# Patient Record
Sex: Female | Born: 2005 | Race: White | Hispanic: No | Marital: Single | Smoking: Current every day smoker
Health system: Southern US, Community
[De-identification: ages and names within clinical notes are randomized; demographics above are authoritative.]

## PROBLEM LIST (undated history)

## (undated) DIAGNOSIS — F32A Depression, unspecified: Secondary | ICD-10-CM

## (undated) DIAGNOSIS — T7840XA Allergy, unspecified, initial encounter: Secondary | ICD-10-CM

## (undated) HISTORY — DX: Allergy, unspecified, initial encounter: T78.40XA

## (undated) HISTORY — DX: Depression, unspecified: F32.A

## (undated) HISTORY — PX: TONSILLECTOMY: SUR1361

---
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
## 2023-01-02 DIAGNOSIS — F331 Major depressive disorder, recurrent, moderate: Secondary | ICD-10-CM | POA: Diagnosis not present

## 2023-01-23 DIAGNOSIS — F331 Major depressive disorder, recurrent, moderate: Secondary | ICD-10-CM | POA: Diagnosis not present

## 2023-01-24 DIAGNOSIS — H5213 Myopia, bilateral: Secondary | ICD-10-CM | POA: Diagnosis not present

## 2023-01-30 DIAGNOSIS — F331 Major depressive disorder, recurrent, moderate: Secondary | ICD-10-CM | POA: Diagnosis not present

## 2023-02-06 DIAGNOSIS — F331 Major depressive disorder, recurrent, moderate: Secondary | ICD-10-CM | POA: Diagnosis not present

## 2023-02-20 DIAGNOSIS — F331 Major depressive disorder, recurrent, moderate: Secondary | ICD-10-CM | POA: Diagnosis not present

## 2023-03-06 DIAGNOSIS — F331 Major depressive disorder, recurrent, moderate: Secondary | ICD-10-CM | POA: Diagnosis not present

## 2023-03-13 DIAGNOSIS — F331 Major depressive disorder, recurrent, moderate: Secondary | ICD-10-CM | POA: Diagnosis not present

## 2023-03-22 DIAGNOSIS — F331 Major depressive disorder, recurrent, moderate: Secondary | ICD-10-CM | POA: Diagnosis not present

## 2023-04-03 DIAGNOSIS — F331 Major depressive disorder, recurrent, moderate: Secondary | ICD-10-CM | POA: Diagnosis not present

## 2023-04-18 DIAGNOSIS — F331 Major depressive disorder, recurrent, moderate: Secondary | ICD-10-CM | POA: Diagnosis not present

## 2023-05-01 DIAGNOSIS — F331 Major depressive disorder, recurrent, moderate: Secondary | ICD-10-CM | POA: Diagnosis not present

## 2023-05-15 DIAGNOSIS — F331 Major depressive disorder, recurrent, moderate: Secondary | ICD-10-CM | POA: Diagnosis not present

## 2023-05-20 ENCOUNTER — Ambulatory Visit: Admitting: Family Medicine

## 2023-05-20 ENCOUNTER — Encounter: Payer: Self-pay | Admitting: Family Medicine

## 2023-05-20 VITALS — BP 122/78 | HR 96 | Temp 98.7°F | Ht 63.8 in | Wt 232.2 lb

## 2023-05-20 DIAGNOSIS — L989 Disorder of the skin and subcutaneous tissue, unspecified: Secondary | ICD-10-CM | POA: Diagnosis not present

## 2023-05-20 MED ORDER — SULFAMETHOXAZOLE-TRIMETHOPRIM 800-160 MG PO TABS: 1.0000 | ORAL_TABLET | Freq: Two times a day (BID) | ORAL | 0 refills | Status: DC

## 2023-05-20 NOTE — Progress Notes (Unsigned)
 She is still in counseling.  Mood d/w pt, improved in the meantime.  She thought that therapy helped and was moved from weekly to every other week.    D/w pt about prev skin lesions, treated with septra .  She has some scarring from the prev lesion on the R breast.    She had new lesions on lower abd wall and inner thigh.  Not painful.  Not itchy or burning.  Incidentally noted.  No fevers.  See exam.  Chaperoned exam.  Discussed weight management with diet exercise and adequate sleep.  Meds, vitals, and allergies reviewed.   ROS: Per HPI unless specifically indicated in ROS section   Nad Ncat Neck supple, no LA Rrr Ctab Abdomen soft.  Chaperoned exam. 4 nodules noted horizontally across the lower abd wall.  Not fluctuant.

## 2023-05-20 NOTE — Patient Instructions (Signed)
 Use gold bond powder to keep the area dry in the meantime.  If not better or if worse, then start septra .  Update me as needed.  Take care.  Glad to see you.

## 2023-05-22 NOTE — Assessment & Plan Note (Signed)
 Discussed options.  These appear to be inflammatory across the lower portion of the pannus. Use gold bond powder to keep the area dry in the meantime.  If not better or if worse, then start septra .  She agrees to plan.  Update me as needed.  None of the lesions appear to need incision and drainage. She does not have a history of axillary lesions suggestive of hidradenitis suppurativa.

## 2023-05-29 DIAGNOSIS — F331 Major depressive disorder, recurrent, moderate: Secondary | ICD-10-CM | POA: Diagnosis not present

## 2023-06-07 DIAGNOSIS — F331 Major depressive disorder, recurrent, moderate: Secondary | ICD-10-CM | POA: Diagnosis not present

## 2023-06-14 DIAGNOSIS — F331 Major depressive disorder, recurrent, moderate: Secondary | ICD-10-CM | POA: Diagnosis not present

## 2023-06-21 DIAGNOSIS — F331 Major depressive disorder, recurrent, moderate: Secondary | ICD-10-CM | POA: Diagnosis not present

## 2023-06-27 ENCOUNTER — Other Ambulatory Visit (HOSPITAL_COMMUNITY)
Admission: RE | Admit: 2023-06-27 | Discharge: 2023-06-27 | Disposition: A | Discharge status: Home / Self Care | Source: Ambulatory Visit | Attending: Obstetrics | Admitting: Obstetrics

## 2023-06-27 ENCOUNTER — Ambulatory Visit (INDEPENDENT_AMBULATORY_CARE_PROVIDER_SITE_OTHER): Admitting: Obstetrics

## 2023-06-27 ENCOUNTER — Encounter: Payer: Self-pay | Admitting: Obstetrics

## 2023-06-27 VITALS — BP 123/81 | HR 94 | Ht 64.0 in | Wt 233.0 lb

## 2023-06-27 DIAGNOSIS — Z113 Encounter for screening for infections with a predominantly sexual mode of transmission: Secondary | ICD-10-CM | POA: Insufficient documentation

## 2023-06-27 DIAGNOSIS — Z01419 Encounter for gynecological examination (general) (routine) without abnormal findings: Secondary | ICD-10-CM

## 2023-06-27 DIAGNOSIS — Z3042 Encounter for surveillance of injectable contraceptive: Secondary | ICD-10-CM

## 2023-06-27 DIAGNOSIS — Z3202 Encounter for pregnancy test, result negative: Secondary | ICD-10-CM | POA: Diagnosis not present

## 2023-06-27 DIAGNOSIS — Z3009 Encounter for other general counseling and advice on contraception: Secondary | ICD-10-CM

## 2023-06-27 LAB — POCT URINE PREGNANCY: Preg Test, Ur: NEGATIVE

## 2023-06-27 MED ORDER — MEDROXYPROGESTERONE ACETATE 150 MG/ML IM SUSY
150.0000 mg | PREFILLED_SYRINGE | Freq: Once | INTRAMUSCULAR | Status: AC
  Administered 2023-06-27: 150 mg via INTRAMUSCULAR

## 2023-06-27 MED ORDER — CLINDAMYCIN PHOSPHATE 1 % EX SOLN: Freq: Two times a day (BID) | CUTANEOUS | 6 refills | Status: DC

## 2023-06-27 NOTE — Progress Notes (Signed)
 ANNUAL GYNECOLOGICAL EXAM  SUBJECTIVE  HPI  Andrea Hawkins is a 18 y.o.-year-old [id number] who presents for an annual gynecological exam today.  She denies pelvic pain, dyspareunia, abnormal vaginal bleeding or discharge, and UTI symptoms. She reports that since February, her period has been heavier and she has had more cramping. She interested in hormonal contraception. She is currently sexually active with one female partner. She has recurring boils on and around her breasts and groin/thighs that she would like examined.  Medical/Surgical History Past Medical History:  Diagnosis Date   Allergy    Depression    Past Surgical History:  Procedure Laterality Date   TONSILLECTOMY      Social History Lives with grandparents. Feels safe there Exercise: yes Substances: Daily vape. Denies EtOH, tobacco, and recreational drugs  Obstetric History OB History     Gravida  0   Para  0   Term  0   Preterm  0   AB  0   Living  0      SAB  0   IAB  0   Ectopic  0   Multiple  0   Live Births  0            GYN/Menstrual History Patient's last menstrual period was 06/15/2023. Regular monthly periods that last 5 days Last Pap: N/A d/t age Contraception: withdrawal, NFP  Prevention Mammogram: at 84 Colonoscopy: at 28  Current Medications Outpatient Medications Prior to Visit  Medication Sig   sulfamethoxazole -trimethoprim  (BACTRIM  DS) 800-160 MG tablet Take 1 tablet by mouth 2 (two) times daily.   No facility-administered medications prior to visit.        ROS Constitutional: Denied constitutional symptoms, night sweats, recent illness, fatigue, fever, insomnia and weight loss.  Eyes: Denied eye symptoms, eye pain, photophobia, vision change and visual disturbance.  Ears/Nose/Throat/Neck: Denied ear, nose, throat or neck symptoms, hearing loss, nasal discharge, sinus congestion and sore throat.  Cardiovascular: Denied cardiovascular symptoms, arrhythmia,  chest pain/pressure, edema, exercise intolerance, orthopnea and palpitations.  Respiratory: Denied pulmonary symptoms, asthma, pleuritic pain, productive sputum, cough, dyspnea and wheezing.  Gastrointestinal: Denied gastro-esophageal reflux, melena, nausea and vomiting.  Genitourinary: Denied genitourinary symptoms including symptomatic vaginal discharge, pelvic relaxation issues, and urinary complaints.  Musculoskeletal: Denied musculoskeletal symptoms, stiffness, swelling, muscle weakness and myalgia.  Dermatologic: Frequent boils in groin and breast area  Neurologic: Denied neurology symptoms, dizziness, headache, neck pain and syncope.  Psychiatric: Depression/anxiety  Endocrine: Denied endocrine symptoms including hot flashes and night sweats.    OBJECTIVE  Ht 5' 4 (1.626 m)   Wt 233 lb (105.7 kg)   LMP 06/15/2023   BMI 39.99 kg/m    Physical examination General NAD, Conversant  HEENT Atraumatic; Op clear with mmm.  Normo-cephalic. Pupils reactive. Anicteric sclerae  Thyroid/Neck Smooth without nodularity or enlargement. Normal ROM.  Neck Supple.  Skin No rashes, lesions or ulceration. Normal palpated skin turgor. No nodularity.  Breasts: No masses or discharge.  Symmetric.  No axillary adenopathy. Multiple healing furuncles under and around breasts.  Lungs: Clear to auscultation.No rales or wheezes. Normal Respiratory effort, no retractions.  Heart: NSR.  No murmurs or rubs appreciated. No peripheral edema  Abdomen: Soft.  Non-tender.  No masses.  No HSM. No hernia  Extremities: Moves all appropriately.  Normal ROM for age. No lymphadenopathy.  Neuro: Oriented to PPT.  Normal mood. Normal affect.     Pelvic: Declined    ASSESSMENT  1) Annual exam 2) Desires contraception  3) Hidradenitis suppurativa  PLAN 1) Physical exam as noted. Discussed healthy lifestyle choices and preventive care. STI testing today. Routine labs done with PCP. Discussed safe sex practices. Pap at  19. 2) Discussed all available options. Andrea Hawkins would like to start Depo. Injection give today. Follow up information given. Recommend vitamin D and calcium supplements. 3) Recommend Hibiclens wash with showers, Lume deodorant in groin and under breasts. Rx sent for clindamycin solution. Refer to derm if no improvement.  Return in one year for annual exam or as needed for concerns.   Andrea Hawkins, CNM

## 2023-06-28 LAB — HEP, RPR, HIV PANEL
HIV Screen 4th Generation wRfx: NONREACTIVE
Hepatitis B Surface Ag: NEGATIVE
RPR Ser Ql: NONREACTIVE

## 2023-07-01 LAB — CERVICOVAGINAL ANCILLARY ONLY
Bacterial Vaginitis (gardnerella): POSITIVE — AB
Candida Glabrata: NEGATIVE
Candida Vaginitis: NEGATIVE
Chlamydia: NEGATIVE
Comment: NEGATIVE
Comment: NEGATIVE
Comment: NEGATIVE
Comment: NEGATIVE
Comment: NEGATIVE
Comment: NORMAL
Neisseria Gonorrhea: NEGATIVE
Trichomonas: NEGATIVE

## 2023-07-03 ENCOUNTER — Encounter: Payer: Self-pay | Admitting: Obstetrics

## 2023-07-03 ENCOUNTER — Other Ambulatory Visit: Payer: Self-pay | Admitting: Obstetrics

## 2023-07-03 MED ORDER — METRONIDAZOLE 500 MG PO TABS: 500.0000 mg | ORAL_TABLET | Freq: Two times a day (BID) | ORAL | 0 refills | Status: DC

## 2023-07-03 NOTE — Progress Notes (Signed)
+  BV. Rx for metronidazole 500 mg PO BID x 7 days sent to pharmacy. Leeandra notified via [place].  M. Norberto Sorenson, CNM

## 2023-07-04 DIAGNOSIS — F331 Major depressive disorder, recurrent, moderate: Secondary | ICD-10-CM | POA: Diagnosis not present

## 2023-07-17 DIAGNOSIS — F331 Major depressive disorder, recurrent, moderate: Secondary | ICD-10-CM | POA: Diagnosis not present

## 2023-07-23 DIAGNOSIS — F331 Major depressive disorder, recurrent, moderate: Secondary | ICD-10-CM | POA: Diagnosis not present

## 2023-08-07 DIAGNOSIS — F331 Major depressive disorder, recurrent, moderate: Secondary | ICD-10-CM | POA: Diagnosis not present

## 2023-08-13 DIAGNOSIS — F331 Major depressive disorder, recurrent, moderate: Secondary | ICD-10-CM | POA: Diagnosis not present

## 2023-08-21 DIAGNOSIS — F331 Major depressive disorder, recurrent, moderate: Secondary | ICD-10-CM | POA: Diagnosis not present

## 2023-09-04 DIAGNOSIS — F331 Major depressive disorder, recurrent, moderate: Secondary | ICD-10-CM | POA: Diagnosis not present

## 2023-09-17 ENCOUNTER — Ambulatory Visit

## 2023-09-17 VITALS — BP 121/82 | HR 85 | Ht 64.0 in | Wt 233.0 lb

## 2023-09-17 DIAGNOSIS — Z3042 Encounter for surveillance of injectable contraceptive: Secondary | ICD-10-CM | POA: Diagnosis not present

## 2023-09-17 MED ORDER — MEDROXYPROGESTERONE ACETATE 150 MG/ML IM SUSY
150.0000 mg | PREFILLED_SYRINGE | Freq: Once | INTRAMUSCULAR | Status: AC
  Administered 2023-09-17: 150 mg via INTRAMUSCULAR

## 2023-09-17 NOTE — Progress Notes (Signed)
    NURSE VISIT NOTE  Subjective:    Patient ID: Andrea Hawkins, female    DOB: 10/05/2005, 18 y.o.   MRN: [id number]  HPI  Patient is a 18 y.o. G0P0000 female who presents for depo provera  injection.   Objective:    There were no vitals taken for this visit.  Last Annual: 06/27/23. Last pap: NA. Last Depo-Provera : 06/27/23. Side Effects if any: none. Serum HCG indicated? No . Depo-Provera  150 mg IM given by: Annalee Sanders, CMA. Site: Right Deltoid  Lab Review  No results found for any visits on 09/17/23.  Assessment:   1. Encounter for Depo-Provera  contraception      Plan:   Next appointment due between nov 18 dec 2 .      Faren Florence H Alyria Krack, CMA

## 2023-09-18 DIAGNOSIS — F331 Major depressive disorder, recurrent, moderate: Secondary | ICD-10-CM | POA: Diagnosis not present

## 2023-10-02 DIAGNOSIS — F331 Major depressive disorder, recurrent, moderate: Secondary | ICD-10-CM | POA: Diagnosis not present

## 2023-10-30 DIAGNOSIS — F331 Major depressive disorder, recurrent, moderate: Secondary | ICD-10-CM | POA: Diagnosis not present

## 2023-11-13 DIAGNOSIS — F331 Major depressive disorder, recurrent, moderate: Secondary | ICD-10-CM | POA: Diagnosis not present

## 2023-11-27 DIAGNOSIS — F331 Major depressive disorder, recurrent, moderate: Secondary | ICD-10-CM | POA: Diagnosis not present

## 2023-11-29 ENCOUNTER — Ambulatory Visit

## 2023-11-29 VITALS — BP 114/75 | HR 95 | Resp 16 | Ht 64.0 in | Wt 239.0 lb

## 2023-11-29 DIAGNOSIS — Z3042 Encounter for surveillance of injectable contraceptive: Secondary | ICD-10-CM | POA: Diagnosis not present

## 2023-11-29 MED ORDER — MEDROXYPROGESTERONE ACETATE 150 MG/ML IM SUSP
150.0000 mg | Freq: Once | INTRAMUSCULAR | Status: AC
  Administered 2023-11-29: 150 mg via INTRAMUSCULAR

## 2023-11-29 NOTE — Progress Notes (Signed)
    NURSE VISIT NOTE  Subjective:    Patient ID: Andrea Hawkins, female    DOB: [birth year], 18 y.o.   MRN: [id number]  HPI  Patient is a 18 y.o. G0P0000 female who presents for depo provera  injection.   Objective:    BP 114/75   Pulse 95   Resp 16   Ht 5' 4 (1.626 m)   Wt 239 lb (108.4 kg)   BMI 41.02 kg/m   Last Annual: 06/27/2023. Last pap: Not age appropriate. Last Depo-Provera : 09/17/2023. Side Effects if any: none. Serum HCG indicated? No . Depo-Provera  150 mg IM given by: Camelia Fetters, CMA. Site: Left Deltoid  Lab Review  No results found for any visits on 11/29/23.  Assessment:   1. Encounter for Depo-Provera  contraception      Plan:   Next appointment due between Jan. 30 and Feb. 13    Camelia Fetters, CMA [facility]

## 2023-11-29 NOTE — Patient Instructions (Signed)

## 2023-12-10 DIAGNOSIS — F331 Major depressive disorder, recurrent, moderate: Secondary | ICD-10-CM | POA: Diagnosis not present

## 2023-12-25 DIAGNOSIS — F331 Major depressive disorder, recurrent, moderate: Secondary | ICD-10-CM | POA: Diagnosis not present

## 2024-01-07 DIAGNOSIS — F331 Major depressive disorder, recurrent, moderate: Secondary | ICD-10-CM | POA: Diagnosis not present

## 2024-02-21 ENCOUNTER — Ambulatory Visit

## 2024-02-21 VITALS — BP 121/88 | HR 93 | Ht 64.0 in | Wt 237.0 lb

## 2024-02-21 DIAGNOSIS — Z3042 Encounter for surveillance of injectable contraceptive: Secondary | ICD-10-CM

## 2024-02-21 MED ORDER — MEDROXYPROGESTERONE ACETATE 150 MG/ML IM SUSP
150.0000 mg | Freq: Once | INTRAMUSCULAR | Status: AC
  Administered 2024-02-21: 150 mg via INTRAMUSCULAR

## 2024-02-21 NOTE — Patient Instructions (Signed)
 Medroxyprogesterone  Injection (Contraception) What is this medication? MEDROXYPROGESTERONE  (me DROX ee proe JES te rone) prevents ovulation and pregnancy. It belongs to a group of medications called contraceptives. This medication is a progestin hormone. This medicine may be used for other purposes; ask your health care provider or pharmacist if you have questions. COMMON BRAND NAME(S): Depo-Provera , Depo-subQ Provera  104 What should I tell my care team before I take this medication? They need to know if you have any of these conditions: Asthma Blood clots Breast cancer or family history of breast cancer Depression Diabetes Eating disorder (anorexia nervosa) Frequently drink alcohol Heart attack High blood pressure HIV infection or AIDS Kidney disease Liver disease Migraine headaches Osteoporosis, weak bones Seizures Stroke Tobacco use Vaginal bleeding An unusual or allergic reaction to medroxyprogesterone , other medications, foods, dyes, or preservatives Pregnant or trying to get pregnant Breast-feeding How should I use this medication? Depo-Provera  CI contraceptive injection is given into a muscle. Depo-subQ Provera  104 injection is given under the skin. It is given in a hospital or clinic setting. The injection is usually given during the first 5 days after the start of a menstrual period or 6 weeks after delivery of a baby. A patient package insert for the product will be given with each prescription and refill. Be sure to read this information carefully each time. The sheet may change often. Talk to your care team about the use of this medication in children. Special care may be needed. These injections have been used in female children who have started having menstrual periods. Overdosage: If you think you have taken too much of this medicine contact a poison control center or emergency room at once. NOTE: This medicine is only for you. Do not share this medicine with  others. What if I miss a dose? Keep appointments for follow-up doses. You must get an injection once every 3 months. It is important not to miss your dose. Call your care team if you are unable to keep an appointment. What may interact with this medication? Antibiotics or medications for infections, especially rifampin and griseofulvin Antivirals for HIV or hepatitis Aprepitant Armodafinil Bexarotene Bosentan Medications for seizures, such as carbamazepine, felbamate, oxcarbazepine, phenytoin, phenobarbital, primidone, topiramate Mitotane Modafinil St. John's Wort This list may not describe all possible interactions. Give your health care provider a list of all the medicines, herbs, non-prescription drugs, or dietary supplements you use. Also tell them if you smoke, drink alcohol, or use illegal drugs. Some items may interact with your medicine. What should I watch for while using this medication? Visit your care team for regular health checks while on this medication. Using this medication does not protect you or your partner against HIV or other sexually transmitted infections (STIs). You may need to use another form of contraception, such as a condom, when you first start taking this medication. This is called backup contraception. This helps prevent pregnancy until your medication has had time to reach its full effect. Talk to your care team. They can help you find the option that works best for you. They can also tell you how long you may need backup contraception. This medication can decrease the amount of calcium in your bones. This weakens your bones and increases the risk of fractures. The risk increases the longer you take this medication. This effect is not reversible. Do not take this medication for more than 2 years unless you are not able to use other forms of contraception. Your care team can help you  find the option that works best for you. They can also help you maintain your bone  health. This medication may change your menstrual cycle pattern. You may have irregular menstrual cycles or spotting, an increase or decrease in bleeding, or no bleeding at all. You may skip periods or your periods may stop. This is normal. If you think you may be pregnant, talk to your care team. Talk to your care team if you plan to get pregnant within the next year. The effect of this medication may last a long time after you get your last injection. What side effects may I notice from receiving this medication? Side effects that you should report to your care team as soon as possible: Allergic reactions--skin rash, itching, hives, swelling of the face, lips, tongue, or throat Blood clot--pain, swelling, or warmth in the leg, shortness of breath, chest pain Gallbladder problems--severe stomach pain, nausea, vomiting, fever Increase in blood pressure Liver injury--right upper belly pain, loss of appetite, nausea, light-colored stool, dark yellow or brown urine, yellowing skin or eyes, unusual weakness or fatigue New or worsening migraines or headaches Seizures Stroke--sudden numbness or weakness of the face, arm, or leg, trouble speaking, confusion, trouble walking, loss of balance or coordination, dizziness, severe headache, change in vision Unusual vaginal discharge, itching, or odor Worsening mood, feelings of depression Side effects that usually do not require medical attention (report to your care team if they continue or are bothersome): Breast pain or tenderness Dark patches of the skin on the face or other sun-exposed areas Irregular menstrual cycles or spotting Nausea Weight gain This list may not describe all possible side effects. Call your doctor for medical advice about side effects. You may report side effects to FDA at 1-800-FDA-1088. Where should I keep my medication? This injection is only given by a care team. It will not be stored at home. NOTE: This sheet is a summary.  It may not cover all possible information. If you have questions about this medicine, talk to your doctor, pharmacist, or health care provider.  2025 [employer] Standard (2023-08-19 00:00:00)

## 2024-02-21 NOTE — Progress Notes (Signed)
" ° ° °  NURSE VISIT NOTE  Subjective:    Patient ID: Andrea Hawkins, female    DOB: [birth year], 19 y.o.   MRN: [id number]  HPI  Patient is a 19 y.o. G0P0000 female who presents for depo provera  injection.  She also reports some pelvic pain last month.  She thinks it may have been an ovarian cyst.  She took Ibuprofen  and the pain resolved but she wanted us  to be aware.   Objective:    BP 121/88   Pulse 93   Ht 5' 4 (1.626 m)   Wt 237 lb (107.5 kg)   BMI 40.68 kg/m   Last Annual: 06/27/2023. Last pap: NA. Last Depo-Provera : 11/29/23. Side Effects if any: none. Serum HCG indicated? No . Depo-Provera  150 mg IM given by: Rollo Louder, RN. Site: Right Deltoid  Lab Review  No results found for any visits on 02/21/24.  Assessment:   1. Encounter for Depo-Provera  contraception      Plan:   Next appointment due between 05/08/24 and 05/22/24.  Advised ovarian cysts can come and go and be normal, but if she develops persistent pain that is not helped by Ibuprofen , she should make an appointment with a higher level provider to discuss.  Rollo FORBES Louder, RN  "

## 2024-05-15 ENCOUNTER — Ambulatory Visit
